# Patient Record
Sex: Female | Born: 2005 | Hispanic: No | Marital: Single | Smoking: Never smoker
Health system: Southern US, Community
[De-identification: ages and names within clinical notes are randomized; demographics above are authoritative.]

## PROBLEM LIST (undated history)

## (undated) DIAGNOSIS — T783XXA Angioneurotic edema, initial encounter: Secondary | ICD-10-CM

## (undated) DIAGNOSIS — J45909 Unspecified asthma, uncomplicated: Secondary | ICD-10-CM

## (undated) DIAGNOSIS — D6851 Activated protein C resistance: Secondary | ICD-10-CM

## (undated) HISTORY — DX: Angioneurotic edema, initial encounter: T78.3XXA

## (undated) HISTORY — DX: Activated protein C resistance: D68.51

## (undated) HISTORY — PX: MYRINGOTOMY: SUR874

## (undated) HISTORY — PX: TYMPANOSTOMY TUBE PLACEMENT: SHX32

## (undated) HISTORY — DX: Unspecified asthma, uncomplicated: J45.909

---
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
## 2023-01-05 ENCOUNTER — Ambulatory Visit: Payer: Medicaid Other | Admitting: Allergy & Immunology

## 2023-01-06 ENCOUNTER — Encounter: Payer: Self-pay | Admitting: Allergy & Immunology

## 2023-01-07 ENCOUNTER — Ambulatory Visit: Payer: Medicaid Other | Admitting: Allergy & Immunology

## 2023-01-11 ENCOUNTER — Other Ambulatory Visit (HOSPITAL_COMMUNITY)
Admission: RE | Admit: 2023-01-11 | Discharge: 2023-01-11 | Disposition: A | Discharge status: Home / Self Care | Payer: Medicaid Other | Source: Ambulatory Visit | Attending: Internal Medicine | Admitting: Internal Medicine

## 2023-01-11 DIAGNOSIS — K625 Hemorrhage of anus and rectum: Secondary | ICD-10-CM | POA: Insufficient documentation

## 2023-01-11 LAB — PREGNANCY, URINE: Preg Test, Ur: NEGATIVE

## 2023-01-13 ENCOUNTER — Encounter (HOSPITAL_COMMUNITY)
Admission: RE | Disposition: A | Discharge status: Home / Self Care | Payer: Self-pay | Source: Ambulatory Visit | Attending: Internal Medicine

## 2023-01-13 ENCOUNTER — Ambulatory Visit (HOSPITAL_COMMUNITY): Payer: Self-pay | Admitting: Anesthesiology

## 2023-01-13 ENCOUNTER — Other Ambulatory Visit: Payer: Self-pay

## 2023-01-13 ENCOUNTER — Ambulatory Visit (HOSPITAL_COMMUNITY): Payer: Medicaid Other | Admitting: Anesthesiology

## 2023-01-13 ENCOUNTER — Encounter (HOSPITAL_COMMUNITY): Payer: Self-pay | Admitting: Internal Medicine

## 2023-01-13 ENCOUNTER — Ambulatory Visit (HOSPITAL_COMMUNITY)
Admission: RE | Admit: 2023-01-13 | Discharge: 2023-01-13 | Disposition: A | Discharge status: Home / Self Care | Payer: Medicaid Other | Source: Ambulatory Visit | Attending: Internal Medicine | Admitting: Internal Medicine

## 2023-01-13 DIAGNOSIS — K625 Hemorrhage of anus and rectum: Secondary | ICD-10-CM | POA: Diagnosis not present

## 2023-01-13 DIAGNOSIS — K921 Melena: Secondary | ICD-10-CM | POA: Diagnosis not present

## 2023-01-13 DIAGNOSIS — J45909 Unspecified asthma, uncomplicated: Secondary | ICD-10-CM | POA: Diagnosis not present

## 2023-01-13 DIAGNOSIS — R112 Nausea with vomiting, unspecified: Secondary | ICD-10-CM | POA: Diagnosis not present

## 2023-01-13 DIAGNOSIS — R109 Unspecified abdominal pain: Secondary | ICD-10-CM | POA: Diagnosis not present

## 2023-01-13 HISTORY — PX: COLONOSCOPY WITH PROPOFOL: SHX5780

## 2023-01-13 SURGERY — COLONOSCOPY WITH PROPOFOL
Anesthesia: General

## 2023-01-13 MED ORDER — LIDOCAINE HCL (CARDIAC) PF 100 MG/5ML IV SOSY
PREFILLED_SYRINGE | INTRAVENOUS | Status: DC | PRN
  Administered 2023-01-13: 60 mg via INTRATRACHEAL

## 2023-01-13 MED ORDER — PROPOFOL 500 MG/50ML IV EMUL
INTRAVENOUS | Status: DC | PRN
  Administered 2023-01-13: 100 mg via INTRAVENOUS
  Administered 2023-01-13: 200 ug/kg/min via INTRAVENOUS
  Administered 2023-01-13: 30 mg via INTRAVENOUS

## 2023-01-13 MED ORDER — LACTATED RINGERS IV SOLN: INTRAVENOUS | Status: DC

## 2023-01-13 NOTE — Anesthesia Preprocedure Evaluation (Signed)
Anesthesia Evaluation  Patient identified by MRN, date of birth, ID band Patient awake    Reviewed: Allergy & Precautions, H&P , NPO status , Patient's Chart, lab work & pertinent test results, reviewed documented beta blocker date and time   Airway Mallampati: II  TM Distance: >3 FB Neck ROM: full    Dental no notable dental hx.    Pulmonary asthma    Pulmonary exam normal breath sounds clear to auscultation       Cardiovascular Exercise Tolerance: Good hypertension, negative cardio ROS  Rhythm:regular Rate:Normal     Neuro/Psych negative neurological ROS  negative psych ROS   GI/Hepatic negative GI ROS, Neg liver ROS,,,  Endo/Other  negative endocrine ROS    Renal/GU negative Renal ROS  negative genitourinary   Musculoskeletal   Abdominal   Peds  Hematology negative hematology ROS (+)   Anesthesia Other Findings   Reproductive/Obstetrics negative OB ROS                             Anesthesia Physical Anesthesia Plan  ASA: 2  Anesthesia Plan: General   Post-op Pain Management:    Induction:   PONV Risk Score and Plan: Propofol infusion  Airway Management Planned:   Additional Equipment:   Intra-op Plan:   Post-operative Plan:   Informed Consent: I have reviewed the patients History and Physical, chart, labs and discussed the procedure including the risks, benefits and alternatives for the proposed anesthesia with the patient or authorized representative who has indicated his/her understanding and acceptance.     Dental Advisory Given  Plan Discussed with: CRNA  Anesthesia Plan Comments:        Anesthesia Quick Evaluation

## 2023-01-13 NOTE — Discharge Instructions (Signed)
  Colonoscopy Discharge Instructions  Read the instructions outlined below and refer to this sheet in the next few weeks. These discharge instructions provide you with general information on caring for yourself after you leave the hospital. Your doctor may also give you specific instructions. While your treatment has been planned according to the most current medical practices available, unavoidable complications occasionally occur. If you have any problems or questions after discharge, call Dr. Jena Gauss at [phone]. ACTIVITY You may resume your regular activity, but move at a slower pace for the next 24 hours.  Take frequent rest periods for the next 24 hours.  Walking will help get rid of the air and reduce the bloated feeling in your belly (abdomen).  No driving for 24 hours (because of the medicine (anesthesia) used during the test).   Do not sign any important legal documents or operate any machinery for 24 hours (because of the anesthesia used during the test).  NUTRITION Drink plenty of fluids.  You may resume your normal diet as instructed by your doctor.  Begin with a light meal and progress to your normal diet. Heavy or fried foods are harder to digest and may make you feel sick to your stomach (nauseated).  Avoid alcoholic beverages for 24 hours or as instructed.  MEDICATIONS You may resume your normal medications unless your doctor tells you otherwise.  WHAT YOU CAN EXPECT TODAY Some feelings of bloating in the abdomen.  Passage of more gas than usual.  Spotting of blood in your stool or on the toilet paper.  IF YOU HAD POLYPS REMOVED DURING THE COLONOSCOPY: No aspirin products for 7 days or as instructed.  No alcohol for 7 days or as instructed.  Eat a soft diet for the next 24 hours.  FINDING OUT THE RESULTS OF YOUR TEST Not all test results are available during your visit. If your test results are not back during the visit, make an appointment with your caregiver to find out the  results. Do not assume everything is normal if you have not heard from your caregiver or the medical facility. It is important for you to follow up on all of your test results.  SEEK IMMEDIATE MEDICAL ATTENTION IF: You have more than a spotting of blood in your stool.  Your belly is swollen (abdominal distention).  You are nauseated or vomiting.  You have a temperature over 101.  You have abdominal pain or discomfort that is severe or gets worse throughout the day.     Your colonoscopy was entirely normal today  Office visit with Tana Coast in 2 weeks  Patient request, called Hilma Calzado at [phone] -reviewed findings and recommendations

## 2023-01-13 NOTE — Op Note (Signed)
[employer] Patient Name: Erin Maldonado Procedure Date: 01/13/2023 6:59 AM MRN: [id number] Date of Birth: [birth year] Attending MD: Gennette Pac , MD, [id number] CSN: [id number] Age: 17 Admit Type: Outpatient Procedure:                Colonoscopy Indications:              Hematochezia Providers:                Gennette Pac, MD, Angelica Ran, Pandora Leiter, Technician Referring MD:              Medicines:                Propofol per Anesthesia Complications:            No immediate complications. Estimated Blood Loss:     Estimated blood loss: none. Procedure:                Pre-Anesthesia Assessment:                           - Prior to the procedure, a History and Physical                            was performed, and patient medications and                            allergies were reviewed. The patient's tolerance of                            previous anesthesia was also reviewed. The risks                            and benefits of the procedure and the sedation                            options and risks were discussed with the patient.                            All questions were answered, and informed consent                            was obtained. Prior Anticoagulants: The patient has                            taken no anticoagulant or antiplatelet agents. ASA                            Grade Assessment: II - A patient with mild systemic                            disease. After reviewing the risks and benefits,  the patient was deemed in satisfactory condition to                            undergo the procedure.                           After obtaining informed consent, the colonoscope                            was passed under direct vision. Throughout the                            procedure, the patient's blood pressure, pulse, and                            oxygen saturations were monitored  continuously. The                            [phone]) scope was introduced through the                            anus and advanced to the 5 cm into the ileum. The                            colonoscopy was performed without difficulty. The                            patient tolerated the procedure well. The quality                            of the bowel preparation was adequate. The entire                            colon was well visualized. Scope In: 7:44:58 AM Scope Out: 8:05:06 AM Scope Withdrawal Time: 0 hours 14 minutes 28 seconds  Total Procedure Duration: 0 hours 20 minutes 8 seconds  Findings:      The perianal and digital rectal examinations were normal.      The colon (entire examined portion) appeared normal.      The retroflexed view of the distal rectum and anal verge was normal and       showed no anal or rectal abnormalities. Distal 5 cm of terminal ileum       appeared normal. Impression:               - The entire examined colon is normal.                            Normal-appearing terminal ileum.                           - The distal rectum and anal verge are normal on                            retroflexion view.                           -  No specimens collected. Moderate Sedation:      Moderate (conscious) sedation was personally administered by an       anesthesia professional. The following parameters were monitored: oxygen       saturation, heart rate, blood pressure, respiratory rate, EKG, adequacy       of pulmonary ventilation, and response to care. Recommendation:           - Patient has a contact number available for                            emergencies. The signs and symptoms of potential                            delayed complications were discussed with the                            patient. Return to normal activities tomorrow.                            Written discharge instructions were provided to the                             patient.                           - Advance diet as tolerated.                           - Continue present medications.                           - Repeat colonoscopy at age 16 for screening                            purposes.                           - Return to GI office in 2 weeks. Procedure Code(s):        --- Professional ---                           [phone], Colonoscopy, flexible; diagnostic, including                            collection of specimen(s) by brushing or washing,                            when performed (separate procedure) Diagnosis Code(s):        --- Professional ---                           K92.1, Melena (includes Hematochezia) CPT copyright 2022 American Medical Association. All rights reserved. The codes documented in this report are preliminary and upon coder review may  be revised to meet current compliance requirements. Gerrit Friends. Jayceion Lisenby, MD Gennette Pac, MD 01/13/2023 8:20:39 AM This report has been signed electronically. Number of Addenda: 0

## 2023-01-13 NOTE — H&P (Signed)
@LOGO @   Primary Care Physician:  Berna Bue, MD Primary Gastroenterologist:  Dr. Jena Gauss  Pre-Procedure History & Physical: HPI:  Erin Maldonado is a 17 y.o. female here for rectal bleeding and right-sided abdominal pain.  Past Medical History:  Diagnosis Date   Angio-edema    Asthma    Factor V Leiden (HCC)    Flexural atopic dermatitis 08/07/2018   Lateral epicondylitis of right elbow 07/10/2019   Seasonal and perennial allergic rhinitis 08/07/2018   Unspecified asthma(493.90) 07/06/2012    Past Surgical History:  Procedure Laterality Date   BIOPSY  09/07/2022   Procedure: BIOPSY;  Surgeon: Lanelle Bal, DO;  Location: [facility];  Service: Endoscopy;;   ESOPHAGOGASTRODUODENOSCOPY (EGD) WITH PROPOFOL N/A 09/07/2022   Procedure: ESOPHAGOGASTRODUODENOSCOPY (EGD) WITH PROPOFOL;  Surgeon: Lanelle Bal, DO;  Location: [facility];  Service: Endoscopy;  Laterality: N/A;  2:00 PM, ASA 2, pt knows to arrive at 9:00   MYRINGOTOMY     TYMPANOSTOMY TUBE PLACEMENT      Prior to Admission medications   Medication Sig Start Date End Date Taking? Authorizing Provider  albuterol (VENTOLIN HFA) 108 (90 Base) MCG/ACT inhaler Inhale 2 puffs into the lungs every 4 (four) hours as needed for wheezing or shortness of breath. 10/30/21  Yes Berna Bue, MD  Crisaborole (EUCRISA) 2 % OINT Apply 1 application  topically 2 (two) times daily as needed. 07/16/22  Yes Ambs, Norvel Richards, FNP  Drospirenone (SLYND) 4 MG TABS Take 1 tablet (4 mg total) by mouth daily. 07/13/22  Yes Cyril Mourning A, NP  famotidine (PEPCID) 20 MG tablet TAKE 1 TABLET(20 MG) BY MOUTH TWICE DAILY 11/30/22  Yes Tiffany Kocher, PA-C  fluticasone (FLONASE) 50 MCG/ACT nasal spray Place 1 spray into both nostrils daily. 01/13/22  Yes Alfonse Spruce, MD  fluticasone (FLOVENT HFA) 110 MCG/ACT inhaler Inhale 1 puff into the lungs daily. 07/16/22  Yes Ambs, Norvel Richards, FNP  levocetirizine (XYZAL) 5 MG tablet Take 1  tablet (5 mg total) by mouth daily as needed for allergies. 07/16/22  Yes Ambs, Norvel Richards, FNP  Multiple Vitamin (MULTIVITAMIN) tablet Take 1 tablet by mouth daily.   Yes [provider]  naproxen (NAPROSYN) 375 MG tablet Take 1 tablet (375 mg total) by mouth 2 (two) times daily. 07/13/21  Yes Berna Bue, MD  Olopatadine HCl 0.2 % SOLN Place 1 drop into both eyes daily as needed. Use 1 drop each eye once a day as needed for itchy watery eyes 01/13/22  Yes Alfonse Spruce, MD  EPINEPHrine 0.3 mg/0.3 mL IJ SOAJ injection Inject 0.3 mg into the muscle as needed for anaphylaxis (GO TO ED AFTER USE). 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 11/17/2020   Justicia adhatoda Anaphylaxis 04/30/2020   Pistachio nut (diagnostic) Anaphylaxis 11/17/2020   Alpha-gal  09/07/2022    Family History  Problem Relation Age of Onset   Allergic rhinitis Mother    Factor V Leiden deficiency Mother    Eczema Father    Asthma Maternal Grandmother    Bipolar disorder Maternal Grandfather    Asthma Maternal Uncle    Migraines Maternal Uncle    ADD / ADHD Maternal Uncle    Asthma Other    Schizophrenia Other    Seizures Neg Hx    Depression Neg Hx    Anxiety  disorder Neg Hx    Autism Neg Hx    Colon cancer Neg Hx    Celiac disease Neg Hx    Inflammatory bowel disease Neg Hx     Social History   Socioeconomic History   Marital status: Single    Spouse name: Not on file   Number of children: Not on file   Years of education: Not on file   Highest education level: Not on file  Occupational History   Not on file  Tobacco Use   Smoking status: Never    Passive exposure: Never   Smokeless tobacco: Never  Vaping Use   Vaping status: Never Used  Substance and Sexual Activity   Alcohol use: No   Drug use: Never   Sexual activity: Not Currently    Birth  control/protection: Condom, Pill  Other Topics Concern   Not on file  Social History Narrative   Erin Maldonado is in the 10th grade at [employer]; She does well in school. She lives with her mother, step-father and brother. She enjoys play tennis.      Social Drivers of Corporate investment banker Strain: Not on file  Food Insecurity: Not on file  Transportation Needs: No Transportation Needs (03/15/2022)   PRAPARE - Administrator, Civil Service (Medical): No    Lack of Transportation (Non-Medical): No  Physical Activity: Not on file  Stress: Not on file  Social Connections: Unknown (11/23/2021)   Received from [facility], [facility]   Social Network    Social Network: Not on file  Intimate Partner Violence: Not At Risk (12/15/2022)   Received from [facility]   HITS    Over the last 12 months how often did your partner physically hurt you?: Never    Over the last 12 months how often did your partner insult you or talk down to you?: Never    Over the last 12 months how often did your partner threaten you with physical harm?: Never    Over the last 12 months how often did your partner scream or curse at you?: Never    Review of Systems: See HPI, otherwise negative ROS  Physical Exam: BP 136/83   Pulse (!) 106   Temp 98.5 F (36.9 C) (Oral)   Resp 18   Ht 5\' 7"  (1.702 m)   Wt 74.8 kg   SpO2 98%   BMI 25.84 kg/m  General:   Alert,  Well-developed, well-nourished, pleasant and cooperative in NAD cal adenopathy. Lungs:  Clear throughout to auscultation.   No wheezes, crackles, or rhonchi. No acute distress. Heart:  Regular rate and rhythm; no murmurs, clicks, rubs,  or gallops. Abdomen: Non-distended, normal bowel sounds.  Soft and nontender without appreciable mass or hepatosplenomegaly.  Pulses:  Normal pulses noted. Extremities:  Without clubbing or edema.  Impression/Plan: 17 year old lady with intermittent rectal bleeding right  side abdominal pain nausea vomiting.  Not much improvement with H2 blocker therapy here for colonoscopy per plan. The risks, benefits, limitations, alternatives and imponderables have been reviewed with the patient. Questions have been answered. All parties are agreeable.       Notice: This dictation was prepared with Dragon dictation along with smaller phrase technology. Any transcriptional errors that result from this process are unintentional and may not be corrected upon review.

## 2023-01-13 NOTE — Transfer of Care (Signed)
Immediate Anesthesia Transfer of Care Note  Patient: Erin Maldonado  Procedure(s) Performed: COLONOSCOPY WITH PROPOFOL  Patient Location: Endoscopy Unit  Anesthesia Type:General  Level of Consciousness: awake  Airway & Oxygen Therapy: Patient Spontanous Breathing  Post-op Assessment: Report given to RN and Post -op Vital signs reviewed and stable  Post vital signs: Reviewed and stable  Last Vitals:  Vitals Value Taken Time  BP 126/72 01/13/23 0807  Temp 36.6 C 01/13/23 0807  Pulse 99 01/13/23 0807  Resp 21 01/13/23 0807  SpO2 100%     Last Pain:  Vitals:   01/13/23 0807  TempSrc: Oral  PainSc: 0-No pain      Patients Stated Pain Goal: 7 (01/13/23 0645)  Complications: No notable events documented.

## 2023-01-14 NOTE — Anesthesia Postprocedure Evaluation (Signed)
Anesthesia Post Note  Patient: Erin Maldonado  Procedure(s) Performed: COLONOSCOPY WITH PROPOFOL  Patient location during evaluation: Phase II Anesthesia Type: General Level of consciousness: awake Pain management: pain level controlled Vital Signs Assessment: post-procedure vital signs reviewed and stable Respiratory status: spontaneous breathing and respiratory function stable Cardiovascular status: blood pressure returned to baseline and stable Postop Assessment: no headache and no apparent nausea or vomiting Anesthetic complications: no Comments: Late entry   No notable events documented.   Last Vitals:  Vitals:   01/13/23 0645 01/13/23 0807  BP: 136/83 126/72  Pulse: (!) 106 99  Resp: 18 (!) 21  Temp: 36.9 C 36.6 C  SpO2: 98%     Last Pain:  Vitals:   01/13/23 0807  TempSrc: Oral  PainSc: 0-No pain                 Windell Norfolk

## 2023-01-24 ENCOUNTER — Other Ambulatory Visit: Payer: Self-pay | Admitting: Gastroenterology

## 2023-01-28 ENCOUNTER — Ambulatory Visit (INDEPENDENT_AMBULATORY_CARE_PROVIDER_SITE_OTHER): Payer: Medicaid Other | Admitting: Gastroenterology

## 2023-01-28 ENCOUNTER — Encounter: Payer: Self-pay | Admitting: Gastroenterology

## 2023-01-28 VITALS — BP 115/80 | HR 97 | Temp 98.4°F | Ht 67.0 in | Wt 183.4 lb

## 2023-01-28 DIAGNOSIS — K625 Hemorrhage of anus and rectum: Secondary | ICD-10-CM

## 2023-01-28 DIAGNOSIS — R1011 Right upper quadrant pain: Secondary | ICD-10-CM | POA: Diagnosis not present

## 2023-01-28 DIAGNOSIS — R109 Unspecified abdominal pain: Secondary | ICD-10-CM | POA: Insufficient documentation

## 2023-01-28 DIAGNOSIS — K219 Gastro-esophageal reflux disease without esophagitis: Secondary | ICD-10-CM | POA: Insufficient documentation

## 2023-01-28 DIAGNOSIS — R112 Nausea with vomiting, unspecified: Secondary | ICD-10-CM

## 2023-01-28 DIAGNOSIS — K2970 Gastritis, unspecified, without bleeding: Secondary | ICD-10-CM | POA: Diagnosis not present

## 2023-01-28 DIAGNOSIS — Z91014 Allergy to mammalian meats: Secondary | ICD-10-CM

## 2023-01-28 DIAGNOSIS — R110 Nausea: Secondary | ICD-10-CM | POA: Insufficient documentation

## 2023-01-28 MED ORDER — PANTOPRAZOLE SODIUM 40 MG PO TBEC: 40.0000 mg | DELAYED_RELEASE_TABLET | Freq: Every day | ORAL | 3 refills | Status: DC

## 2023-01-28 NOTE — Progress Notes (Signed)
 GI Office Note    Referring Provider: Akhbari, Rozita, MD Primary Care Physician:  Akhbari, Rozita, MD  Primary Gastroenterologist: Ozell Hollingshead, MD   Chief Complaint   Chief Complaint  Patient presents with   Follow-up    Follow up from colonoscopy. Pt states she is about the same since the colonoscopy    History of Present Illness   Erin Maldonado is a 18 y.o. female presenting today for follow up. Last seen virtually 11/2022. She has since completed colonoscopy for rectal bleeding, see below for details. She has history of N/V, weight loss. 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  Celiac serologies, sed rate, CRP, thyroid  labs previously unremarkable in July 2024.  Today: Since her last office visit she has been monitoring her right upper quadrant pain and nausea/vomiting.  When she was still at school before winter break she was still consuming high-fat foods, consisting of pizza, chicken tenders, French fries.  She noted postprandial right-sided abdominal pain at least a few times per week lasting for 1 to 2 hours at a time.  Since she has been home she has been cooking a lot more.  She is cutting out a lot of grease from her diet.  She has noted much less abdominal pain and nausea.  Never really had typical heartburn symptoms but often would have regurgitation into her throat or feeling like food was stuck in her chest but this has improved with low-fat diet as well.  She noted mild improvement with Pepcid  previously.  Bowel function is normal.  Occasional bright red blood per rectum.  Goes back to school in a couple of weeks. Eats at the [employer].  Has plans to try to eat healthier but notes that food options often are limited.    Labs November 2024: Urinalysis with trace ketones, 500 leukocyte esterase, white blood cells 12, RBCs 4, occasional bacteria, magnesium 1.9, white blood cell count 10,100, hemoglobin 14.1, platelets 270,000, sodium 137, potassium 4.2, glucose 96,  creatinine 0.8, total bilirubin 0.4, albumin 4.6, alkaline phosphatase 65, AST 38, ALT 44.  EGD 08/2022: -normal esophagus s/p bx neg for EOE -gastritis s/p bx neg for H.pylori -normal duodenal  Colonoscopy 12/2022: -normal colon, TI, rectum.  -repeat colonoscopy at age 37.  Wt Readings from Last 10 Encounters:  01/28/23 183 lb 6.4 oz (83.2 kg) (95%, Z= 1.69)*  01/13/23 165 lb (74.8 kg) (90%, Z= 1.31)*  11/29/22 167 lb (75.8 kg) (91%, Z= 1.36)*  09/07/22 170 lb (77.1 kg) (93%, Z= 1.45)*  08/16/22 167 lb 6.4 oz (75.9 kg) (92%, Z= 1.39)*  07/27/22 164 lb 9.6 oz (74.7 kg) (91%, Z= 1.33)*  07/16/22 168 lb 9.6 oz (76.5 kg) (92%, Z= 1.42)*  07/13/22 166 lb 8 oz (75.5 kg) (92%, Z= 1.38)*  05/04/22 175 lb 8 oz (79.6 kg) (94%, Z= 1.58)*  01/13/22 178 lb 6.4 oz (80.9 kg) (95%, Z= 1.65)*   * Growth percentiles are based on CDC (Girls, 2-20 Years) data.     Medications   Current Outpatient Medications  Medication Sig Dispense Refill   albuterol  (VENTOLIN  HFA) 108 (90 Base) MCG/ACT inhaler Inhale 2 puffs into the lungs every 4 (four) hours as needed for wheezing or shortness of breath. 18 g 1   Crisaborole  (EUCRISA ) 2 % OINT Apply 1 application  topically 2 (two) times daily as needed. 100 g 5   Drospirenone  (SLYND ) 4 MG TABS Take 1 tablet (4 mg total) by mouth daily. 28 tablet  12   EPINEPHrine  0.3 mg/0.3 mL IJ SOAJ injection Inject 0.3 mg into the muscle as needed for anaphylaxis (GO TO ED AFTER USE). 2 each 1   famotidine  (PEPCID ) 20 MG tablet TAKE 1 TABLET(20 MG) BY MOUTH TWICE DAILY 180 tablet 1   fluticasone  (FLONASE ) 50 MCG/ACT nasal spray Place 1 spray into both nostrils daily. 16 g 5   fluticasone  (FLOVENT  HFA) 110 MCG/ACT inhaler Inhale 1 puff into the lungs daily. 36 g 1   levocetirizine (XYZAL ) 5 MG tablet Take 1 tablet (5 mg total) by mouth daily as needed for allergies. 30 tablet 5   Multiple Vitamin (MULTIVITAMIN) tablet Take 1 tablet by mouth daily.     naproxen  (NAPROSYN )  375 MG tablet Take 1 tablet (375 mg total) by mouth 2 (two) times daily. 20 tablet 2   Olopatadine  HCl 0.2 % SOLN Place 1 drop into both eyes daily as needed. Use 1 drop each eye once a day as needed for itchy watery eyes 7.5 mL 1   No current facility-administered medications for this visit.    Allergies   Allergies as of 01/28/2023 - Review Complete 01/28/2023  Allergen Reaction Noted   Cashew nut (anacardium occidentale) skin test Anaphylaxis 11/17/2020   Justicia adhatoda Anaphylaxis 04/30/2020   Pistachio nut (diagnostic) Anaphylaxis 11/17/2020   Alpha-gal  09/07/2022      Review of Systems   General: Negative for anorexia,  fever, chills, fatigue, weakness.  Fluctuates between 160 and 180 pounds.   ENT: Negative for hoarseness, difficulty swallowing , nasal congestion. CV: Negative for chest pain, angina, palpitations, dyspnea on exertion, peripheral edema.  Respiratory: Negative for dyspnea at rest, dyspnea on exertion, cough, sputum, wheezing.  GI: See history of present illness. GU:  Negative for dysuria, hematuria, urinary incontinence, urinary frequency, nocturnal urination.  Endo: Negative for unusual weight change.     Physical Exam   BP 115/80   Pulse 97   Temp 98.4 F (36.9 C)   Ht 5' 7 (1.702 m)   Wt 183 lb 6.4 oz (83.2 kg)   LMP 05/03/2022   BMI 28.72 kg/m    General: Well-nourished, well-developed in no acute distress.  Eyes: No icterus. Mouth: Oropharyngeal mucosa moist and pink  ps.  Abdomen: Bowel sounds are normal, nontender, nondistended, no hepatosplenomegaly or masses,  no abdominal bruits or hernia , no rebound or guarding.  Rectal: not performed  Extremities: No lower extremity edema. No clubbing or deformities. Neuro: Alert and oriented x 4   Skin: Warm and dry, no jaundice.   Psych: Alert and cooperative, normal mood and affect.  Labs  See hpi  Imaging Studies   No results found.  Assessment/Plan:   RUQ  pain/nausea/vomiting/gastritis: -symptoms likely due to GERD vs gastritis but biliary etiology not excluded -improved with low fat/low grease diet -continues to avoid pork/lamb due to alpha-gal -concern that she will have recurrent symptoms once back at school and food choices are more limited. If recurrent symptoms, she will start pantoprazole  40mg  daily before breakfast. RX provided. If symptoms persists on PPI, then consider abd u/s to rule out biliary etiology. -patient will reach out if recurrent symptoms, otherwise return ov as needed  Weight loss: resolved -monitor, she will let me know if recurrent weight loss noted  Rectal bleeding: colonoscopy resassuring -avoid constipation -continue to monitor     Sonny RAMAN. Ezzard, MHS, PA-C [facility]

## 2023-01-28 NOTE — Patient Instructions (Addendum)
 Continue to eat clean diet with low fat/low grease.  You can stop famotidine  for now if you are still on it. I have sent in RX for pantoprazole  40mg  daily before breakfast, start if you have recurrent abdominal pain, nausea, reflux type symptoms.  Let me know if you have any recurrence of symptoms or persistent weight loss especially if does not resolve with use of pantoprazole . We would consider ultrasound of your gallbladder at that time.

## 2023-02-03 ENCOUNTER — Encounter (HOSPITAL_COMMUNITY): Payer: Self-pay | Admitting: Internal Medicine

## 2023-02-18 DIAGNOSIS — R221 Localized swelling, mass and lump, neck: Secondary | ICD-10-CM | POA: Diagnosis not present

## 2023-02-18 DIAGNOSIS — R5383 Other fatigue: Secondary | ICD-10-CM | POA: Diagnosis not present

## 2023-02-23 DIAGNOSIS — E049 Nontoxic goiter, unspecified: Secondary | ICD-10-CM | POA: Diagnosis not present

## 2023-02-23 DIAGNOSIS — R5383 Other fatigue: Secondary | ICD-10-CM | POA: Diagnosis not present

## 2023-02-23 DIAGNOSIS — E042 Nontoxic multinodular goiter: Secondary | ICD-10-CM | POA: Diagnosis not present

## 2023-05-05 ENCOUNTER — Other Ambulatory Visit: Payer: Self-pay | Admitting: Allergy & Immunology

## 2023-06-21 ENCOUNTER — Other Ambulatory Visit: Payer: Self-pay | Admitting: Allergy & Immunology

## 2023-08-02 ENCOUNTER — Other Ambulatory Visit: Payer: Self-pay | Admitting: Adult Health

## 2023-08-26 ENCOUNTER — Ambulatory Visit (INDEPENDENT_AMBULATORY_CARE_PROVIDER_SITE_OTHER): Admitting: Allergy & Immunology

## 2023-08-26 ENCOUNTER — Encounter: Payer: Self-pay | Admitting: Allergy & Immunology

## 2023-08-26 VITALS — BP 106/80 | HR 132 | Temp 98.2°F | Resp 14 | Ht 67.52 in | Wt 184.4 lb

## 2023-08-26 DIAGNOSIS — L2089 Other atopic dermatitis: Secondary | ICD-10-CM

## 2023-08-26 DIAGNOSIS — J3089 Other allergic rhinitis: Secondary | ICD-10-CM

## 2023-08-26 DIAGNOSIS — T7800XD Anaphylactic reaction due to unspecified food, subsequent encounter: Secondary | ICD-10-CM

## 2023-08-26 DIAGNOSIS — J4530 Mild persistent asthma, uncomplicated: Secondary | ICD-10-CM

## 2023-08-26 DIAGNOSIS — J302 Other seasonal allergic rhinitis: Secondary | ICD-10-CM | POA: Diagnosis not present

## 2023-08-26 MED ORDER — EUCRISA 2 % EX OINT: 1.0000 | TOPICAL_OINTMENT | Freq: Two times a day (BID) | CUTANEOUS | 5 refills | Status: AC | PRN

## 2023-08-26 MED ORDER — FLUTICASONE PROPIONATE HFA 110 MCG/ACT IN AERO: 1.0000 | INHALATION_SPRAY | Freq: Every day | RESPIRATORY_TRACT | 3 refills | Status: AC

## 2023-08-26 MED ORDER — CROMOLYN SODIUM 4 % OP SOLN: 1.0000 [drp] | Freq: Four times a day (QID) | OPHTHALMIC | 12 refills | Status: AC

## 2023-08-26 MED ORDER — FLUTICASONE PROPIONATE 50 MCG/ACT NA SUSP: 1.0000 | Freq: Every day | NASAL | 3 refills | Status: AC

## 2023-08-26 MED ORDER — ALBUTEROL SULFATE HFA 108 (90 BASE) MCG/ACT IN AERS: 2.0000 | INHALATION_SPRAY | RESPIRATORY_TRACT | 1 refills | Status: DC | PRN

## 2023-08-26 MED ORDER — EPINEPHRINE 0.3 MG/0.3ML IJ SOAJ: 0.3000 mg | INTRAMUSCULAR | 1 refills | Status: AC | PRN

## 2023-08-26 MED ORDER — LEVOCETIRIZINE DIHYDROCHLORIDE 5 MG PO TABS: 5.0000 mg | ORAL_TABLET | Freq: Two times a day (BID) | ORAL | 3 refills | Status: AC

## 2023-08-26 NOTE — Progress Notes (Signed)
 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 - Continue Flovent  110-2 puffs once a day with a spacer to prevent cough or wheeze - Continue albuterol  2 puffs once every 4 hours as needed for cough or wheeze - You may use albuterol  2 puffs 5 to 15 minutes before activity to decrease cough or wheeze - For asthma flare increase Flovent  110 to 2 puffs twice a day for 1 to 2 weeks or until cough and wheeze free, then return to 2 puffs once a day with a spacer  Allergic rhinitis grasses, weeds, ragweed, trees, dust mite, cat, dog, horse, hamster, israel pig, gerbil, rat, cattle, and mold) - Continue with levocetirizine 5 mg but increase to TWICE DAILY - Continue Flonase  2 sprays in each nostril once a day as needed for a stuffy nose.  In the right nostril, point the applicator out toward the right ear. In the left nostril, point the applicator out toward the left ear - Consider saline nasal rinses as needed for nasal symptoms. Use this before any medicated nasal sprays for best result - Consider allergen immunotherapy if your symptoms are not well-controlled with the treatment plan as listed above  Atopic dermatitis - Continue a twice a day moisturizing routine - Continue Eucrisa  to red and itchy areas up to twice a day as needed  Food allergy  - Continue to avoid cashew and pistachio and red meat.  Consider oral immunotherapy for cashew and pistachio.  In case of an allergic reaction, take Benadryl 50   capsules every 4 hours, and if life-threatening symptoms occur, inject with EpiPen  0.3 mg.  Return in about 6 months (around 02/26/2024). You can have the follow up appointment with Dr. Iva or a Nurse Practicioner (our Nurse Practitioners are excellent and always have Physician oversight!).    Subjective:   Erin Maldonado is a 18 y.o. female presenting today for follow up of  Chief Complaint  Patient presents with   Follow-up    Some eczema flare ups, red,itchy eyes    Medication Refill    Erin Maldonado has a history of the following: Patient Active Problem List   Diagnosis Date Noted   Abdominal pain 01/28/2023   Nausea without vomiting 01/28/2023   GERD (gastroesophageal reflux disease) 01/28/2023   Rectal bleeding 11/29/2022   N&V (nausea and vomiting) 08/16/2022   Loss of weight 08/16/2022   Encounter for surveillance of contraceptive pills 07/13/2022   Encounter for initial prescription of contraceptive pills 05/04/2022   Pregnancy examination or test, negative result 05/04/2022   Screening for STD (sexually transmitted disease) 05/04/2022   Encounter for menstrual regulation 05/04/2022   Seasonal allergies 11/23/2021   Heterozygous factor V Leiden mutation (HCC) 07/13/2021   Seasonal allergic rhinitis due to pollen 07/13/2021   Dysmenorrhea 07/13/2021   BMI (body mass index), pediatric, 85% to less than 95% for age 46/19/2023   Amenorrhea due to Depo Provera  11/17/2020   Anaphylactic reaction due to food, subsequent encounter 08/01/2019   Lateral epicondylitis of  right elbow 07/10/2019   Seasonal and perennial allergic rhinitis 08/07/2018   Flexural atopic dermatitis 08/07/2018   Mild persistent asthma, uncomplicated 08/07/2018   Allergic conjunctivitis of both eyes 08/07/2018   Well child check 10/23/2012   Wart 10/23/2012   Overweight, pediatric, BMI (body mass index) 95-99% for age 66/29/2014   Asthma 07/06/2012   Asthma 07/06/2012    History obtained  from: chart review and patient.  Discussed the use of AI scribe software for clinical note transcription with the patient and/or guardian, who gave verbal consent to proceed.  Erin Maldonado is a 18 y.o. female presenting for a follow up visit.  She was last seen in June 2024 by Arlean Mutter, one of our nurse practitioners.  She was continued on Flovent  110 mcg 2 puffs twice daily and albuterol  as needed.  For her allergic rhinitis, she started Xyzal  5 mg daily and continue with Flonase  and nasal saline rinses.  Atopic dermatitis was controlled with Eucrisa  as needed.  She continue to avoid cashew and pistachio.  She was referred to GI for nausea and postprandial vomiting.  Since last visit, she has done well.   Asthma/Respiratory Symptom History: She uses her daily inhaler, Flovent , regularly for her asthma. She has not needed to use her emergency inhaler recently and experiences no nighttime coughing.  Allergic Rhinitis Symptom History: She experiences significant allergy  symptoms, including itchy eyes that start in her left eye and progress to both eyes becoming red and watery, along with a runny nose. She has not been exposed to animals or outdoor environments that could trigger these symptoms. Previously prescribed eye drops are no longer covered by her insurance. She currently uses Flonase  and Xyzal  (levocetirizine) for her allergies. She has a lot of mucus and phlegm in her throat, for which she continues to use Flonase  and Xyzal .  Food Allergy  Symptom History: She was diagnosed with alpha-gal syndrome last summer, which restricts her from eating pork and lamb, but she can consume beef. This diagnosis was made by a gastroenterologist.  Skin Symptom History: She has been experiencing skin breakouts on her wrist and neck, which she attributes to sweating. Eucrisa  has been effective in managing these breakouts.  She is in her final semester of college, planning to study for the LSAT and apply to law school  in the spring. She is considering studying abroad in [place], specifically the [place].   Otherwise, there have been no changes to her past medical history, surgical history, family history, or social history.    Review of systems otherwise negative other than that mentioned in the HPI.    Objective:   Blood pressure 106/80, pulse (!) 132, temperature 98.2 F (36.8 C), resp. rate 14, height 5' 7.52 (1.715 m), weight 184 lb 6 oz (83.6 kg), SpO2 98%. Body mass index is 28.43 kg/m.    Physical Exam Vitals reviewed.  Constitutional:      Appearance: Normal appearance. She is well-developed.     Comments: Very friendly. Smiling. Grown up. Confident.  HENT:     Head: Normocephalic and atraumatic.     Right Ear: Tympanic membrane, ear canal and external ear normal.     Left Ear: Tympanic membrane, ear canal and external ear normal.     Nose: No nasal deformity, septal deviation, mucosal edema or rhinorrhea.     Right Turbinates: Enlarged, swollen and pale.     Left Turbinates: Enlarged, swollen and pale.     Right Sinus: No maxillary sinus tenderness or frontal  sinus tenderness.     Left Sinus: No maxillary sinus tenderness or frontal sinus tenderness.     Comments: Scant clear rhinorrhea.  No polyps.    Mouth/Throat:     Mouth: Mucous membranes are not pale and not dry.     Pharynx: Uvula midline.  Eyes:     General: Lids are normal. No allergic shiner.       Right eye: No discharge.        Left eye: No discharge.     Conjunctiva/sclera: Conjunctivae normal.     Right eye: Right conjunctiva is not injected. No chemosis.    Left eye: Left conjunctiva is not injected. No chemosis.    Pupils: Pupils are equal, round, and reactive to light.  Cardiovascular:     Rate and Rhythm: Normal rate and regular rhythm.     Heart sounds: Normal heart sounds.  Pulmonary:     Effort: Pulmonary effort is normal. No tachypnea, accessory muscle usage or respiratory distress.     Breath sounds:  Normal breath sounds. No wheezing, rhonchi or rales.     Comments: Moving air well in all lung fields.  No increased work of breathing. Chest:     Chest wall: No tenderness.  Lymphadenopathy:     Cervical: No cervical adenopathy.  Skin:    General: Skin is warm.     Capillary Refill: Capillary refill takes less than 2 seconds.     Coloration: Skin is not pale.     Findings: No abrasion, erythema, petechiae or rash. Rash is not papular, urticarial or vesicular.     Comments: No eczematous or urticarial lesions noted.  Neurological:     Mental Status: She is alert.  Psychiatric:        Behavior: Behavior is cooperative.      Diagnostic studies:    Spirometry: results normal (FEV1: 3.20/94%, FVC: 3.59/93%, FEV1/FVC: 89%).    Spirometry consistent with normal pattern.    Allergy  Studies: none        Marty Shaggy, MD  [facility] 

## 2023-08-26 NOTE — Patient Instructions (Addendum)
 Asthma - Spiro looks great today. - Continue Flovent  110-2 puffs once a day with a spacer to prevent cough or wheeze - Continue albuterol  2 puffs once every 4 hours as needed for cough or wheeze - You may use albuterol  2 puffs 5 to 15 minutes before activity to decrease cough or wheeze - For asthma flare increase Flovent  110 to 2 puffs twice a day for 1 to 2 weeks or until cough and wheeze free, then return to 2 puffs once a day with a spacer  Allergic rhinitis grasses, weeds, ragweed, trees, dust mite, cat, dog, horse, hamster, israel pig, gerbil, rat, cattle, and mold) - Continue with levocetirizine 5 mg but increase to TWICE DAILY - Continue Flonase  2 sprays in each nostril once a day as needed for a stuffy nose.  In the right nostril, point the applicator out toward the right ear. In the left nostril, point the applicator out toward the left ear - Consider saline nasal rinses as needed for nasal symptoms. Use this before any medicated nasal sprays for best result - Consider allergen immunotherapy if your symptoms are not well-controlled with the treatment plan as listed above  Atopic dermatitis - Continue a twice a day moisturizing routine - Continue Eucrisa  to red and itchy areas up to twice a day as needed  Food allergy  - Continue to avoid cashew and pistachio and red meat.  Consider oral immunotherapy for cashew and pistachio.  In case of an allergic reaction, take Benadryl 50  capsules every 4 hours, and if life-threatening symptoms occur, inject with EpiPen  0.3 mg.  Return in about 6 months (around 02/26/2024). You can have the follow up appointment with Dr. Iva or a Nurse Practicioner (our Nurse Practitioners are excellent and always have Physician oversight!).    Please inform us  of any Emergency Department visits, hospitalizations, or changes in symptoms. Call us  before going to the ED for breathing or allergy  symptoms since we might be able to fit you in for a sick visit. Feel  free to contact us  anytime with any questions, problems, or concerns.  It was a pleasure to see you again today! Good luck with law school!   Websites that have reliable patient information: 1. American Academy of Asthma, Allergy , and Immunology: www.aaaai.org 2. Food Allergy  Research and Education (FARE): foodallergy.org 3. Mothers of Asthmatics: http://www.asthmacommunitynetwork.org 4. [employer] of Allergy , Asthma, and Immunology: www.acaai.org      "Like" us  on Facebook and Instagram for our latest updates!      A healthy democracy works best when [employer] participate! Make sure you are registered to vote! If you have moved or changed any of your contact information, you will need to get this updated before voting! Scan the QR codes below to learn more!

## 2023-08-29 ENCOUNTER — Other Ambulatory Visit (HOSPITAL_COMMUNITY): Payer: Self-pay

## 2023-08-29 ENCOUNTER — Telehealth: Payer: Self-pay

## 2023-08-29 NOTE — Telephone Encounter (Signed)
 Pharmacy Patient Advocate Encounter  Received notification from [facility] that Prior Authorization for Eucrisa  2% ointment  has been APPROVED from 08/29/2023 to 08/28/2024. Ran test claim, Copay is $0.00. This test claim was processed through [facility]- copay amounts may vary at other pharmacies due to pharmacy/plan contracts, or as the patient moves through the different stages of their insurance plan.

## 2023-08-29 NOTE — Telephone Encounter (Signed)
*  AA  Pharmacy Patient Advocate Encounter   Received notification from CoverMyMeds that prior authorization for Eucrisa  2% ointment  is required/requested.   Insurance verification completed.   The patient is insured through [facility] .   Per test claim: PA required; PA submitted to above mentioned insurance via CoverMyMeds Key/confirmation #/[id number] Status is pending

## 2023-10-13 ENCOUNTER — Other Ambulatory Visit: Payer: Self-pay | Admitting: Allergy & Immunology

## 2023-10-13 ENCOUNTER — Ambulatory Visit: Admitting: Adult Health

## 2023-10-13 DIAGNOSIS — J4530 Mild persistent asthma, uncomplicated: Secondary | ICD-10-CM

## 2023-10-17 ENCOUNTER — Ambulatory Visit (INDEPENDENT_AMBULATORY_CARE_PROVIDER_SITE_OTHER): Admitting: Adult Health

## 2023-10-17 ENCOUNTER — Encounter: Payer: Self-pay | Admitting: Adult Health

## 2023-10-17 VITALS — BP 132/84 | HR 87 | Ht 68.0 in | Wt 190.0 lb

## 2023-10-17 DIAGNOSIS — N926 Irregular menstruation, unspecified: Secondary | ICD-10-CM

## 2023-10-17 DIAGNOSIS — Z3041 Encounter for surveillance of contraceptive pills: Secondary | ICD-10-CM | POA: Diagnosis not present

## 2023-10-17 DIAGNOSIS — Z7689 Persons encountering health services in other specified circumstances: Secondary | ICD-10-CM

## 2023-10-17 DIAGNOSIS — N946 Dysmenorrhea, unspecified: Secondary | ICD-10-CM

## 2023-10-17 MED ORDER — SLYND 4 MG PO TABS: 1.0000 | ORAL_TABLET | Freq: Every day | ORAL | 3 refills | Status: AC

## 2023-10-17 NOTE — Progress Notes (Signed)
  Subjective:     Patient ID: Erin Maldonado, female   DOB: [birth year], 18 y.o.   MRN: [id number]  HPI Erin Maldonado is a 18 year old female,single, G0P0, back in follow up on taking slynd  and likes it, no pain or bleeding. She is losing medicaid in October, and going on parents insurance, did discuss Family Planning Medicaid Review of Systems No pain or bleeding with slynd  Denies any headaches Not having se Reviewed past medical,surgical, social and family history. Reviewed medications and allergies.     Objective:   Physical Exam BP 132/84 (BP Location: Right Arm, Patient Position: Sitting, Cuff Size: Normal)   Pulse 87   Ht 5' 8 (1.727 m)   Wt 190 lb (86.2 kg)   BMI 28.89 kg/m     Skin warm and dry. Lungs: clear to ausculation bilaterally. Cardiovascular: regular rate and rhythm. Fall risk is low  Upstream - 10/17/23 1401       Pregnancy Intention Screening   Does the patient want to become pregnant in the next year? No    Does the patient's partner want to become pregnant in the next year? No    Would the patient like to discuss contraceptive options today? No      Contraception Wrap Up   Current Method Oral Contraceptive    End Method Oral Contraceptive    Contraception Counseling Provided Yes           Assessment:     1. Encounter for menstrual regulation (Primary) No bleeding with Slynd   2. Encounter for surveillance of contraceptive pills Happy with slynd  Will refill Meds ordered this encounter  Medications   Drospirenone  (SLYND ) 4 MG TABS    Sig: Take 1 tablet (4 mg total) by mouth daily.    Dispense:  84 tablet    Refill:  3    Supervising Provider:   JAYNE MINDER H [[id number]]     3. Dysmenorrhea No cramps or pain with slynd     Plan:    GC/CHL was negative 10/11/23  Follow up in 1 year

## 2023-11-17 ENCOUNTER — Ambulatory Visit: Admitting: Adult Health

## 2024-03-09 ENCOUNTER — Ambulatory Visit: Admitting: Allergy & Immunology
